# Patient Record
Sex: Female | Born: 1965 | Smoking: Never smoker
Health system: Southern US, Community
[De-identification: ages and names within clinical notes are randomized; demographics above are authoritative.]

## PROBLEM LIST (undated history)

## (undated) HISTORY — PX: BREAST SURGERY: SHX581

## (undated) HISTORY — PX: CHOLECYSTECTOMY: SHX55

## (undated) HISTORY — PX: BREAST LUMPECTOMY: SHX2

## (undated) HISTORY — PX: BREAST REDUCTION SURGERY: SHX8

---

## 2018-11-03 ENCOUNTER — Other Ambulatory Visit: Payer: Self-pay

## 2018-11-03 DIAGNOSIS — Z20822 Contact with and (suspected) exposure to covid-19: Secondary | ICD-10-CM

## 2018-11-04 LAB — NOVEL CORONAVIRUS, NAA: SARS-CoV-2, NAA: NOT DETECTED

## 2018-12-25 ENCOUNTER — Other Ambulatory Visit: Payer: Self-pay

## 2018-12-25 DIAGNOSIS — Z20822 Contact with and (suspected) exposure to covid-19: Secondary | ICD-10-CM

## 2018-12-26 LAB — NOVEL CORONAVIRUS, NAA: SARS-CoV-2, NAA: NOT DETECTED

## 2018-12-27 ENCOUNTER — Telehealth: Payer: Self-pay | Admitting: General Practice

## 2018-12-27 NOTE — Telephone Encounter (Signed)
Negative COVID results given. Patient results "NOT Detected." Caller expressed understanding. ° °

## 2019-03-26 ENCOUNTER — Other Ambulatory Visit: Payer: Self-pay | Admitting: Orthopedic Surgery

## 2019-03-26 DIAGNOSIS — M75101 Unspecified rotator cuff tear or rupture of right shoulder, not specified as traumatic: Secondary | ICD-10-CM

## 2019-03-26 DIAGNOSIS — M7541 Impingement syndrome of right shoulder: Secondary | ICD-10-CM

## 2019-03-31 ENCOUNTER — Other Ambulatory Visit: Payer: Self-pay

## 2019-03-31 ENCOUNTER — Ambulatory Visit
Admission: RE | Admit: 2019-03-31 | Discharge: 2019-03-31 | Disposition: A | Discharge status: Home / Self Care | Payer: BC Managed Care – PPO | Source: Ambulatory Visit | Attending: Orthopedic Surgery | Admitting: Orthopedic Surgery

## 2019-03-31 DIAGNOSIS — M75101 Unspecified rotator cuff tear or rupture of right shoulder, not specified as traumatic: Secondary | ICD-10-CM | POA: Diagnosis present

## 2019-03-31 DIAGNOSIS — M7541 Impingement syndrome of right shoulder: Secondary | ICD-10-CM | POA: Insufficient documentation

## 2019-04-24 ENCOUNTER — Other Ambulatory Visit: Payer: Self-pay | Admitting: Surgery

## 2019-04-27 ENCOUNTER — Encounter
Admission: RE | Admit: 2019-04-27 | Discharge: 2019-04-27 | Disposition: A | Discharge status: Home / Self Care | Payer: BC Managed Care – PPO | Source: Ambulatory Visit | Attending: Surgery | Admitting: Surgery

## 2019-04-27 ENCOUNTER — Other Ambulatory Visit: Payer: Self-pay

## 2019-04-27 NOTE — Pre-Procedure Instructions (Signed)
Dr Roland Rack contacted via secure chat regarding order for consent and patients perception of her procedure. See below:   Me: Hey Dr Roland Rack I need some clarification on her consent. The order is for a right shoulder manipulation under anesthesia with steroid injection. When I interviewed the patient she was under the impression you were also going to remove a bone spur and/or repair anything else internally you had found. Please advise. TY    Dr Roland Rack: Yes, the patient is correct. The procedure is going to be a manipulation under anesthesia with steroid injection, possible right shoulder arthroscopy with debridement and decompression. Basically, if I get a good release with just the manipulation I will stop there. If I do not, then I will proceed with the arthroscopic procedure. Thank you!    Me: I will add this to the consent. Would it be possible for you to add this to your order?

## 2019-04-27 NOTE — Patient Instructions (Signed)
Your procedure is scheduled on: 05/02/19 Report to [facility]. To find out your arrival time please call [phone] between 1PM - 3PM on 05/01/19.  Remember: Instructions that are not followed completely may result in serious medical risk, up to and including death, or upon the discretion of your surgeon and anesthesiologist your surgery may need to be rescheduled.     _X__ 1. Do not eat food after midnight the night before your procedure.                 No gum chewing or hard candies. You may drink clear liquids up to 2 hours                 before you are scheduled to arrive for your surgery- DO not drink clear                 liquids within 2 hours of the start of your surgery.                 Clear Liquids include:  water, apple juice without pulp, clear carbohydrate                 drink such as Clearfast or Gatorade, Black Coffee or Tea (Do not add                 anything to coffee or tea). Diabetics water only  __X__2.  On the morning of surgery brush your teeth with toothpaste and water, you                 may rinse your mouth with mouthwash if you wish.  Do not swallow any              toothpaste of mouthwash.     _X__ 3.  No Alcohol for 24 hours before or after surgery.   _X__ 4.  Do Not Smoke or use e-cigarettes For 24 Hours Prior to Your Surgery.                 Do not use any chewable tobacco products for at least 6 hours prior to                 surgery.  ____  5.  Bring all medications with you on the day of surgery if instructed.   __X__  6.  Notify your doctor if there is any change in your medical condition      (cold, fever, infections).     Do not wear jewelry, make-up, hairpins, clips or nail polish. Do not wear lotions, powders, or perfumes.  Do not shave 48 hours prior to surgery. Men may shave face and neck. Do not bring valuables to the hospital.    [facility] is not responsible for any belongings or  valuables.  Contacts, dentures/partials or body piercings may not be worn into surgery. Bring a case for your contacts, glasses or hearing aids, a denture cup will be supplied. Leave your suitcase in the car. After surgery it may be brought to your room. For patients admitted to the hospital, discharge time is determined by your treatment team.   Patients discharged the day of surgery will not be allowed to drive home.   Please read over the following fact sheets that you were given:   MRSA Information  __X__ Take these medicines the morning of surgery with A SIP OF WATER:  1. none  2. May take valtrex if needed  3.   4.  5.  6.  ____ Fleet Enema (as directed)   __X__ Use CHG Soap/SAGE wipes as directed  ____ Use inhalers on the day of surgery  ____ Stop metformin/Janumet/Farxiga 2 days prior to surgery    ____ Take 1/2 of usual insulin dose the night before surgery. No insulin the morning          of surgery.   ____ Stop Blood Thinners Coumadin/Plavix/Xarelto/Pleta/Pradaxa/Eliquis/Effient/Aspirin  on   Or contact your Surgeon, Cardiologist or Medical Doctor regarding  ability to stop your blood thinners  __X__ Stop Anti-inflammatories 7 days before surgery such as Advil, Ibuprofen, Motrin,  BC or Goodies Powder, Naprosyn, Naproxen, Aleve, Aspirin    __X__ Stop all herbal supplements, fish oil or vitamin E until after surgery. Stopped Melatonin and Aleve 04/27/19   ____ Bring C-Pap to the hospital.

## 2019-04-30 ENCOUNTER — Other Ambulatory Visit: Payer: Self-pay

## 2019-04-30 ENCOUNTER — Other Ambulatory Visit
Admission: RE | Admit: 2019-04-30 | Discharge: 2019-04-30 | Disposition: A | Discharge status: Home / Self Care | Payer: BC Managed Care – PPO | Source: Ambulatory Visit | Attending: Surgery | Admitting: Surgery

## 2019-04-30 DIAGNOSIS — Z20822 Contact with and (suspected) exposure to covid-19: Secondary | ICD-10-CM | POA: Insufficient documentation

## 2019-04-30 DIAGNOSIS — Z01812 Encounter for preprocedural laboratory examination: Secondary | ICD-10-CM | POA: Insufficient documentation

## 2019-04-30 LAB — SARS CORONAVIRUS 2 (TAT 6-24 HRS): SARS Coronavirus 2: NEGATIVE

## 2019-05-02 ENCOUNTER — Ambulatory Visit
Admission: RE | Admit: 2019-05-02 | Discharge: 2019-05-02 | Disposition: A | Discharge status: Home / Self Care | Payer: BC Managed Care – PPO | Attending: Surgery | Admitting: Surgery

## 2019-05-02 ENCOUNTER — Ambulatory Visit: Payer: BC Managed Care – PPO | Admitting: Anesthesiology

## 2019-05-02 ENCOUNTER — Encounter: Payer: Self-pay | Admitting: Surgery

## 2019-05-02 ENCOUNTER — Encounter
Admission: RE | Disposition: A | Discharge status: Home / Self Care | Payer: Self-pay | Source: Home / Self Care | Attending: Surgery

## 2019-05-02 ENCOUNTER — Other Ambulatory Visit: Payer: Self-pay

## 2019-05-02 DIAGNOSIS — Z825 Family history of asthma and other chronic lower respiratory diseases: Secondary | ICD-10-CM | POA: Diagnosis not present

## 2019-05-02 DIAGNOSIS — Z801 Family history of malignant neoplasm of trachea, bronchus and lung: Secondary | ICD-10-CM | POA: Insufficient documentation

## 2019-05-02 DIAGNOSIS — Z9049 Acquired absence of other specified parts of digestive tract: Secondary | ICD-10-CM | POA: Diagnosis not present

## 2019-05-02 DIAGNOSIS — M7501 Adhesive capsulitis of right shoulder: Secondary | ICD-10-CM | POA: Diagnosis not present

## 2019-05-02 DIAGNOSIS — Z79899 Other long term (current) drug therapy: Secondary | ICD-10-CM | POA: Diagnosis not present

## 2019-05-02 HISTORY — PX: CLOSED MANIPULATION SHOULDER WITH STERIOD INJECTION: SHX5611

## 2019-05-02 LAB — POCT PREGNANCY, URINE: Preg Test, Ur: NEGATIVE

## 2019-05-02 SURGERY — CLOSED MANIPULATION SHOULDER WITH STEROID INJECTION
Anesthesia: General | Site: Shoulder | Laterality: Right

## 2019-05-02 MED ORDER — PROPOFOL 500 MG/50ML IV EMUL
INTRAVENOUS | Status: AC
  Filled 2019-05-02: qty 50

## 2019-05-02 MED ORDER — DEXAMETHASONE SODIUM PHOSPHATE 10 MG/ML IJ SOLN
INTRAMUSCULAR | Status: AC
  Filled 2019-05-02: qty 1

## 2019-05-02 MED ORDER — POTASSIUM CHLORIDE IN NACL 20-0.9 MEQ/L-% IV SOLN: INTRAVENOUS | Status: DC

## 2019-05-02 MED ORDER — FAMOTIDINE 20 MG PO TABS: 20.0000 mg | ORAL_TABLET | Freq: Once | ORAL | Status: AC

## 2019-05-02 MED ORDER — ONDANSETRON HCL 4 MG/2ML IJ SOLN: 4.0000 mg | Freq: Four times a day (QID) | INTRAMUSCULAR | Status: DC | PRN

## 2019-05-02 MED ORDER — TRIAMCINOLONE ACETONIDE 40 MG/ML IJ SUSP
INTRAMUSCULAR | Status: AC
  Filled 2019-05-02: qty 1

## 2019-05-02 MED ORDER — EPHEDRINE 5 MG/ML INJ
INTRAVENOUS | Status: AC
  Filled 2019-05-02: qty 20

## 2019-05-02 MED ORDER — LIDOCAINE HCL (PF) 2 % IJ SOLN
INTRAMUSCULAR | Status: AC
  Filled 2019-05-02: qty 10

## 2019-05-02 MED ORDER — FENTANYL CITRATE (PF) 100 MCG/2ML IJ SOLN
25.0000 ug | INTRAMUSCULAR | Status: DC | PRN
  Administered 2019-05-02: 25 ug via INTRAVENOUS

## 2019-05-02 MED ORDER — LACTATED RINGERS IV SOLN: INTRAVENOUS | Status: DC

## 2019-05-02 MED ORDER — CEFAZOLIN SODIUM-DEXTROSE 2-4 GM/100ML-% IV SOLN
INTRAVENOUS | Status: AC
  Filled 2019-05-02: qty 100

## 2019-05-02 MED ORDER — METOCLOPRAMIDE HCL 5 MG/ML IJ SOLN: 5.0000 mg | Freq: Three times a day (TID) | INTRAMUSCULAR | Status: DC | PRN

## 2019-05-02 MED ORDER — FENTANYL CITRATE (PF) 100 MCG/2ML IJ SOLN
INTRAMUSCULAR | Status: AC
  Administered 2019-05-02: 25 ug via INTRAVENOUS
  Filled 2019-05-02: qty 2

## 2019-05-02 MED ORDER — LIDOCAINE HCL (PF) 2 % IJ SOLN
INTRAMUSCULAR | Status: AC
  Filled 2019-05-02: qty 5

## 2019-05-02 MED ORDER — FENTANYL CITRATE (PF) 100 MCG/2ML IJ SOLN
INTRAMUSCULAR | Status: AC
  Filled 2019-05-02: qty 2

## 2019-05-02 MED ORDER — TRAMADOL HCL 50 MG PO TABS: 50.0000 mg | ORAL_TABLET | Freq: Four times a day (QID) | ORAL | Status: DC | PRN

## 2019-05-02 MED ORDER — MIDAZOLAM HCL 2 MG/2ML IJ SOLN
INTRAMUSCULAR | Status: DC | PRN
  Administered 2019-05-02: 2 mg via INTRAVENOUS

## 2019-05-02 MED ORDER — ONDANSETRON HCL 4 MG PO TABS: 4.0000 mg | ORAL_TABLET | Freq: Four times a day (QID) | ORAL | Status: DC | PRN

## 2019-05-02 MED ORDER — BUPIVACAINE-EPINEPHRINE 0.25% -1:200000 IJ SOLN
INTRAMUSCULAR | Status: DC | PRN
  Administered 2019-05-02: 30 mL

## 2019-05-02 MED ORDER — BUPIVACAINE-EPINEPHRINE (PF) 0.25% -1:200000 IJ SOLN
INTRAMUSCULAR | Status: AC
  Filled 2019-05-02: qty 30

## 2019-05-02 MED ORDER — MIDAZOLAM HCL 2 MG/2ML IJ SOLN
INTRAMUSCULAR | Status: AC
  Filled 2019-05-02: qty 2

## 2019-05-02 MED ORDER — CEFAZOLIN SODIUM-DEXTROSE 2-4 GM/100ML-% IV SOLN: 2.0000 g | INTRAVENOUS | Status: DC

## 2019-05-02 MED ORDER — FENTANYL CITRATE (PF) 100 MCG/2ML IJ SOLN
INTRAMUSCULAR | Status: DC | PRN
  Administered 2019-05-02: 50 ug via INTRAVENOUS

## 2019-05-02 MED ORDER — PROPOFOL 500 MG/50ML IV EMUL
INTRAVENOUS | Status: DC | PRN
  Administered 2019-05-02: 140 ug/kg/min via INTRAVENOUS

## 2019-05-02 MED ORDER — ONDANSETRON HCL 4 MG/2ML IJ SOLN: 4.0000 mg | Freq: Once | INTRAMUSCULAR | Status: DC | PRN

## 2019-05-02 MED ORDER — TRAMADOL HCL 50 MG PO TABS: 50.0000 mg | ORAL_TABLET | Freq: Four times a day (QID) | ORAL | 0 refills | Status: AC | PRN

## 2019-05-02 MED ORDER — CHLORHEXIDINE GLUCONATE 4 % EX LIQD: 60.0000 mL | Freq: Once | CUTANEOUS | Status: DC

## 2019-05-02 MED ORDER — TRIAMCINOLONE ACETONIDE 40 MG/ML IJ SUSP
INTRAMUSCULAR | Status: DC | PRN
  Administered 2019-05-02: 40 mg via INTRAMUSCULAR

## 2019-05-02 MED ORDER — SUCCINYLCHOLINE CHLORIDE 200 MG/10ML IV SOSY
PREFILLED_SYRINGE | INTRAVENOUS | Status: AC
  Filled 2019-05-02: qty 10

## 2019-05-02 MED ORDER — GLYCOPYRROLATE 0.2 MG/ML IJ SOLN
INTRAMUSCULAR | Status: AC
  Filled 2019-05-02: qty 1

## 2019-05-02 MED ORDER — FAMOTIDINE 20 MG PO TABS
ORAL_TABLET | ORAL | Status: AC
  Administered 2019-05-02: 20 mg via ORAL
  Filled 2019-05-02: qty 1

## 2019-05-02 MED ORDER — KETOROLAC TROMETHAMINE 30 MG/ML IJ SOLN
INTRAMUSCULAR | Status: AC
  Filled 2019-05-02: qty 1

## 2019-05-02 MED ORDER — ONDANSETRON HCL 4 MG/2ML IJ SOLN
INTRAMUSCULAR | Status: AC
  Filled 2019-05-02: qty 2

## 2019-05-02 MED ORDER — METOCLOPRAMIDE HCL 10 MG PO TABS: 5.0000 mg | ORAL_TABLET | Freq: Three times a day (TID) | ORAL | Status: DC | PRN

## 2019-05-02 SURGICAL SUPPLY — 9 items
BNDG ADH 1X3 SHEER STRL LF (GAUZE/BANDAGES/DRESSINGS) ×1 IMPLANT
COVER WAND RF STERILE (DRAPES) ×1 IMPLANT
KIT TURNOVER KIT A (KITS) ×3 IMPLANT
NDL HYPO 21X1.5 SAFETY (NEEDLE) ×1 IMPLANT
NEEDLE HYPO 21X1.5 SAFETY (NEEDLE) ×3 IMPLANT
PAD ALCOHOL SWAB (MISCELLANEOUS) ×6 IMPLANT
SLING ARM LRG DEEP (SOFTGOODS) ×1 IMPLANT
SLING ARM M TX990204 (SOFTGOODS) ×3 IMPLANT
SYR 10ML LL (SYRINGE) ×3 IMPLANT

## 2019-05-02 NOTE — Transfer of Care (Signed)
Immediate Anesthesia Transfer of Care Note  Patient: Frances Massey  Procedure(s) Performed: RIGHT CLOSED MANIPULATION SHOULDER WITH STEROID INJECTION (Right Shoulder)  Patient Location: PACU  Anesthesia Type:General  Level of Consciousness: awake, alert  and oriented  Airway & Oxygen Therapy: Patient Spontanous Breathing and Patient connected to face mask oxygen  Post-op Assessment: Report given to RN and Post -op Vital signs reviewed and stable  Post vital signs: Reviewed and stable  Last Vitals:  Vitals Value Taken Time  BP 151/79 05/02/19 1017  Temp 36.4 C 05/02/19 1015  Pulse 84 05/02/19 1019  Resp 19 05/02/19 1019  SpO2 99 % 05/02/19 1019  Vitals shown include unvalidated device data.  Last Pain:  Vitals:   05/02/19 1015  TempSrc:   PainSc: 2          Complications: No apparent anesthesia complications

## 2019-05-02 NOTE — Anesthesia Postprocedure Evaluation (Signed)
Anesthesia Post Note  Patient: Gracin Abramyan  Procedure(s) Performed: RIGHT CLOSED MANIPULATION SHOULDER WITH STEROID INJECTION (Right Shoulder)  Patient location during evaluation: PACU Anesthesia Type: General Level of consciousness: awake and alert and oriented Pain management: pain level controlled Vital Signs Assessment: post-procedure vital signs reviewed and stable Respiratory status: spontaneous breathing, nonlabored ventilation and respiratory function stable Cardiovascular status: blood pressure returned to baseline and stable Postop Assessment: no signs of nausea or vomiting Anesthetic complications: no     Last Vitals:  Vitals:   05/02/19 1047 05/02/19 1056  BP: 134/76 135/65  Pulse: 72 69  Resp: 20 16  Temp: (!) 36.4 C 36.7 C  SpO2: 98% 100%    Last Pain:  Vitals:   05/02/19 1056  TempSrc: Temporal  PainSc: 2                  Maryana Pittmon

## 2019-05-02 NOTE — Discharge Instructions (Addendum)
Orthopedic discharge instructions: May shower as necessary.  Apply ice frequently to shoulder. Take Aleve 2 tablets BID with meals for 7-10 days, then as necessary. May supplement with ES Tylenol if necessary. Take pain medication as prescribed when needed.  Use sling as necessary for comfort, but you are encouraged to remove it as soon as possible. Start physical therapy on Thursday as scheduled. Follow-up in 10-14 days or as scheduled.  AMBULATORY SURGERY  DISCHARGE INSTRUCTIONS   1) The drugs that you were given will stay in your system until tomorrow so for the next 24 hours you should not:  A) Drive an automobile B) Make any legal decisions C) Drink any alcoholic beverage   2) You may resume regular meals tomorrow.  Today it is better to start with liquids and gradually work up to solid foods.  You may eat anything you prefer, but it is better to start with liquids, then soup and crackers, and gradually work up to solid foods.   3) Please notify your doctor immediately if you have any unusual bleeding, trouble breathing, redness and pain at the surgery site, drainage, fever, or pain not relieved by medication.    4) Additional Instructions:        Please contact your physician with any problems or Same Day Surgery at [phone], Monday through Friday 6 am to 4 pm, or  at [facility] number at [phone].

## 2019-05-02 NOTE — Anesthesia Preprocedure Evaluation (Signed)
Anesthesia Evaluation  Patient identified by MRN, date of birth, ID band Patient awake    Reviewed: Allergy & Precautions, NPO status , Patient's Chart, lab work & pertinent test results  History of Anesthesia Complications Negative for: history of anesthetic complications  Airway Mallampati: I  TM Distance: >3 FB Neck ROM: Full    Dental no notable dental hx.    Pulmonary neg pulmonary ROS, neg sleep apnea, neg COPD,    breath sounds clear to auscultation- rhonchi (-) wheezing      Cardiovascular Exercise Tolerance: Good (-) hypertension(-) CAD and (-) Past MI  Rhythm:Regular Rate:Normal - Systolic murmurs and - Diastolic murmurs    Neuro/Psych negative neurological ROS  negative psych ROS   GI/Hepatic negative GI ROS, Neg liver ROS,   Endo/Other  negative endocrine ROSneg diabetes  Renal/GU negative Renal ROS     Musculoskeletal negative musculoskeletal ROS (+)   Abdominal (+) - obese,   Peds  Hematology negative hematology ROS (+)   Anesthesia Other Findings    Reproductive/Obstetrics                             Anesthesia Physical Anesthesia Plan  ASA: I  Anesthesia Plan: General   Post-op Pain Management:  Regional for Post-op pain   Induction: Intravenous  PONV Risk Score and Plan: 2 and Propofol infusion  Airway Management Planned: Natural Airway  Additional Equipment:   Intra-op Plan:   Post-operative Plan:   Informed Consent: I have reviewed the patients History and Physical, chart, labs and discussed the procedure including the risks, benefits and alternatives for the proposed anesthesia with the patient or authorized representative who has indicated his/her understanding and acceptance.     Dental advisory given  Plan Discussed with: CRNA and Anesthesiologist  Anesthesia Plan Comments: (Discussed possible intubation if need to proceed with shoulder scope.  Discussed possible post op block for pain if we proceed with shoulder scope.)        Anesthesia Quick Evaluation

## 2019-05-02 NOTE — Op Note (Signed)
05/02/2019  10:09 AM  Patient:   Frances Massey  Pre-Op Diagnosis:   Primary adhesive capsulitis, right shoulder.  Post-Op Diagnosis:   Same  Procedure:   Manipulation under anesthesia with steroid injection, right shoulder.  Surgeon:   Pascal Lux, MD  Assistant:   None  Anesthesia:   IV sedation  Findings:   As above. Prior to manipulation, the right shoulder could be forward flexed to 135 and abducted to 130. At 90 of abduction, the shoulder could be externally rotated to 70 and internally rotated to 45. Following manipulation, the shoulder could be forward flexed to 170, abducted to 165 and, at 90 of abduction, externally rotated to 95 and internally rotated to 70.  Complications:   None  EBL:   0 cc  Fluids:   500 cc crystalloid  TT:   None  Drains:   None  Closure:   None  Brief Clinical Note:   The patient is a 54 year old female with a 69-month history of gradually worsening right shoulder pain which developed without any specific cause or injury. Despite extensive nonsurgical treatment, including injections, activity modification, medications, etc., the patient continues to have difficulty regaining shoulder range of motion. The patient's history and examination are consistent with adhesive capsulitis. The patient presents at this time for a manipulation under anesthesia with steroid injection of the right shoulder.  Procedure:   The patient was brought into the operating room and lain in the supine position. After adequate IV sedation was achieved, a timeout was performed to verify the correct surgical site. The right shoulder was gently manipulated in both abduction and external rotation, as well as adduction and internal rotation. Several palpable and audible pops were heard as the scar tissue released, permitting full range of motion of the shoulder. The glenohumeral joint was injected sterilely using 1 cc of Kenalog-40 and 9 cc of 0.25% Sensorcaine with  epinephrine before the patient was placed into a sling. The patient was then awakened and returned to the recovery room in satisfactory condition after tolerating the procedure well.

## 2019-05-02 NOTE — H&P (Signed)
Paper H&P to be scanned into permanent record. H&P reviewed and patient re-examined. No changes. 

## 2020-09-08 ENCOUNTER — Encounter: Payer: Self-pay | Admitting: Gastroenterology

## 2020-09-18 ENCOUNTER — Ambulatory Visit (AMBULATORY_SURGERY_CENTER): Payer: BC Managed Care – PPO

## 2020-09-18 ENCOUNTER — Other Ambulatory Visit: Payer: Self-pay

## 2020-09-18 VITALS — Ht 61.0 in | Wt 160.0 lb

## 2020-09-18 DIAGNOSIS — Z1211 Encounter for screening for malignant neoplasm of colon: Secondary | ICD-10-CM

## 2020-09-18 NOTE — Progress Notes (Signed)
Patient's pre-visit was done today over the phone with the patient Name,DOB and address verified. Patient denies any allergies to Eggs and Soy. Patient denies any problems with anesthesia/sedation. Patient denies taking diet pills or blood thinners. No home Oxygen. Packet of Prep instructions mailed to patient including a copy of a consent form-pt is aware. Patient understands to call us back with any questions or concerns. Patient is aware of our care-partner policy and 0000000 safety protocol.   The patient is COVID-19 vaccinated.

## 2020-09-30 ENCOUNTER — Encounter: Payer: Self-pay | Admitting: Gastroenterology

## 2020-09-30 ENCOUNTER — Ambulatory Visit (AMBULATORY_SURGERY_CENTER): Payer: BC Managed Care – PPO | Admitting: Gastroenterology

## 2020-09-30 VITALS — BP 108/78 | HR 58 | Temp 99.1°F | Resp 14 | Ht 61.0 in | Wt 160.0 lb

## 2020-09-30 DIAGNOSIS — D122 Benign neoplasm of ascending colon: Secondary | ICD-10-CM

## 2020-09-30 DIAGNOSIS — Z1211 Encounter for screening for malignant neoplasm of colon: Secondary | ICD-10-CM | POA: Diagnosis not present

## 2020-09-30 MED ORDER — SODIUM CHLORIDE 0.9 % IV SOLN: 500.0000 mL | Freq: Once | INTRAVENOUS | Status: DC

## 2020-09-30 NOTE — Op Note (Signed)
[facility] Patient Name: Frances Massey Procedure Date: 09/30/2020 2:23 PM MRN: [id number] Endoscopist: Justice Britain , MD Age: 55 Referring MD:  Date of Birth: 07-13-1965 Gender: Female Account #: [id number] Procedure:                Colonoscopy Indications:              Screening for colorectal malignant neoplasm, This                            is the patient's first colonoscopy Medicines:                Monitored Anesthesia Care Procedure:                Pre-Anesthesia Assessment:                           - Prior to the procedure, a History and Physical                            was performed, and patient medications and                            allergies were reviewed. The patient's tolerance of                            previous anesthesia was also reviewed. The risks                            and benefits of the procedure and the sedation                            options and risks were discussed with the patient.                            All questions were answered, and informed consent                            was obtained. Prior Anticoagulants: The patient has                            taken no previous anticoagulant or antiplatelet                            agents except for NSAID medication. ASA Grade                            Assessment: II - A patient with mild systemic                            disease. After reviewing the risks and benefits,                            the patient was deemed in satisfactory condition to  undergo the procedure.                           After obtaining informed consent, the colonoscope                            was passed under direct vision. Throughout the                            procedure, the patient's blood pressure, pulse, and                            oxygen saturations were monitored continuously. The                            Olympus PCF-H190DL (#[id number]) Colonoscope  was                            introduced through the anus and advanced to the 5                            cm into the ileum. The colonoscopy was performed                            without difficulty. The patient tolerated the                            procedure. The quality of the bowel preparation was                            good. The terminal ileum, ileocecal valve,                            appendiceal orifice, and rectum were photographed. Scope In: 2:31:34 PM Scope Out: 2:45:59 PM Scope Withdrawal Time: 0 hours 10 minutes 53 seconds  Total Procedure Duration: 0 hours 14 minutes 25 seconds  Findings:                 The digital rectal exam findings include                            hemorrhoids. Pertinent negatives include no                            palpable rectal lesions.                           The terminal ileum and ileocecal valve appeared                            normal.                           A 2 mm polyp was found in the ascending colon. The  polyp was sessile. The polyp was removed with a                            cold snare. Resection and retrieval were complete.                           A few small-mouthed diverticula were found in the                            sigmoid colon.                           Normal mucosa was found in the entire colon                            otherwise.                           Non-bleeding non-thrombosed internal hemorrhoids                            were found during retroflexion, during perianal                            exam and during digital exam. The hemorrhoids were                            Grade II (internal hemorrhoids that prolapse but                            reduce spontaneously). Complications:            No immediate complications. Estimated Blood Loss:     Estimated blood loss was minimal. Impression:               - Hemorrhoids found on digital rectal exam.                            - The examined portion of the ileum was normal.                           - One 2 mm polyp in the ascending colon, removed                            with a cold snare. Resected and retrieved.                           - Diverticulosis in the sigmoid colon.                           - Normal mucosa in the entire examined colon                            otherwise.                           -  Non-bleeding non-thrombosed internal hemorrhoids. Recommendation:           - The patient will be observed post-procedure,                            until all discharge criteria are met.                           - Discharge patient to home.                           - Patient has a contact number available for                            emergencies. The signs and symptoms of potential                            delayed complications were discussed with the                            patient. Return to normal activities tomorrow.                            Written discharge instructions were provided to the                            patient.                           - High fiber diet.                           - Use FiberCon 1-2 tablets PO daily.                           - Continue present medications.                           - Await pathology results.                           - Repeat colonoscopy in 06/21/08 years for                            surveillance based on pathology results and                            findings of adenomatous tissue.                           - The findings and recommendations were discussed                            with the patient.                           - The findings and recommendations were discussed  with the patient's family. Justice Britain, MD 09/30/2020 2:52:38 PM

## 2020-09-30 NOTE — Patient Instructions (Signed)
Handout given:  diverticulosis, polyps, hemorrhoids Start a higher fiber diet  Use Fibercon 1-2 tab by mouth daily Continue current medications Await pathology results  YOU HAD AN ENDOSCOPIC PROCEDURE TODAY AT [facility]:   Refer to the procedure report that was given to you for any specific questions about what was found during the examination.  If the procedure report does not answer your questions, please call your gastroenterologist to clarify.  If you requested that your care partner not be given the details of your procedure findings, then the procedure report has been included in a sealed envelope for you to review at your convenience later.  YOU SHOULD EXPECT: Some feelings of bloating in the abdomen. Passage of more gas than usual.  Walking can help get rid of the air that was put into your GI tract during the procedure and reduce the bloating. If you had a lower endoscopy (such as a colonoscopy or flexible sigmoidoscopy) you may notice spotting of blood in your stool or on the toilet paper. If you underwent a bowel prep for your procedure, you may not have a normal bowel movement for a few days.  Please Note:  You might notice some irritation and congestion in your nose or some drainage.  This is from the oxygen used during your procedure.  There is no need for concern and it should clear up in a day or so.  SYMPTOMS TO REPORT IMMEDIATELY:  Following lower endoscopy (colonoscopy or flexible sigmoidoscopy):  Excessive amounts of blood in the stool  Significant tenderness or worsening of abdominal pains  Swelling of the abdomen that is new, acute  Fever of 100F or higher  For urgent or emergent issues, a gastroenterologist can be reached at any hour by calling [phone]. Do not use MyChart messaging for urgent concerns.   DIET:  We do recommend a small meal at first, but then you may proceed to your regular diet.  Drink plenty of fluids but you should avoid  alcoholic beverages for 24 hours.  ACTIVITY:  You should plan to take it easy for the rest of today and you should NOT DRIVE or use heavy machinery until tomorrow (because of the sedation medicines used during the test).    FOLLOW UP: Our staff will call the number listed on your records 48-72 hours following your procedure to check on you and address any questions or concerns that you may have regarding the information given to you following your procedure. If we do not reach you, we will leave a message.  We will attempt to reach you two times.  During this call, we will ask if you have developed any symptoms of COVID 19. If you develop any symptoms (ie: fever, flu-like symptoms, shortness of breath, cough etc.) before then, please call [phone].  If you test positive for Covid 19 in the 2 weeks post procedure, please call and report this information to [place].    If any biopsies w SIGNATURES/CONFIDENTIALITY: You and/or your care partner have signed paperwork which will be entered into your electronic medical record.  These signatures attest to the fact that that the information above on your After Visit Summary has been reviewed and is understood.  Full responsibility of the confidentiality of this discharge information lies with you and/or your care-partner.

## 2020-09-30 NOTE — Progress Notes (Signed)
GASTROENTEROLOGY PROCEDURE H&P NOTE   Primary Care Physician: Pcp, No  HPI: Frances Massey is a 55 y.o. female who presents for Colonoscopy for screening.  No past medical history on file. Past Surgical History:  Procedure Laterality Date   BREAST LUMPECTOMY Right    BREAST REDUCTION SURGERY     BREAST SURGERY     CHOLECYSTECTOMY     CLOSED MANIPULATION SHOULDER WITH STERIOD INJECTION Right 05/02/2019   Procedure: RIGHT CLOSED MANIPULATION SHOULDER WITH STEROID INJECTION;  Surgeon: Corky Mull, MD;  Location: [facility] ORS;  Service: Orthopedics;  Laterality: Right;   Current Outpatient Medications  Medication Sig Dispense Refill   acetaminophen (TYLENOL) 500 MG tablet Take 1,500-2,000 mg by mouth 2 (two) times daily as needed for moderate pain. (Patient not taking: Reported on 09/18/2020)     eszopiclone (LUNESTA) 2 MG TABS tablet Take 2 mg by mouth at bedtime as needed for sleep. Take immediately before bedtime (Patient not taking: Reported on 09/18/2020)     Ibuprofen (MOTRIN PO) Take by mouth.     LO LOESTRIN FE 1 MG-10 MCG / 10 MCG tablet Take 1 tablet by mouth daily.     Magnesium 250 MG TABS Take 250 mg by mouth daily.     Melatonin 5 MG CAPS Take 15 mg by mouth at bedtime.     Menthol-Methyl Salicylate (SALONPAS PAIN RELIEF PATCH) PTCH Apply 1 patch topically daily as needed (pain). (Patient not taking: Reported on 09/18/2020)     Multiple Vitamin (MULTIVITAMIN WITH MINERALS) TABS tablet Take 1 tablet by mouth daily.     naproxen sodium (ALEVE) 220 MG tablet Take 440 mg by mouth 2 (two) times daily as needed (pain). (Patient not taking: Reported on 09/18/2020)     valACYclovir (VALTREX) 1000 MG tablet Take 2 g by mouth 2 (two) times daily as needed (fever blister).      No current facility-administered medications for this visit.    Current Outpatient Medications:    acetaminophen (TYLENOL) 500 MG tablet, Take 1,500-2,000 mg by mouth 2 (two) times daily as needed for moderate pain.  (Patient not taking: Reported on 09/18/2020), Disp: , Rfl:    eszopiclone (LUNESTA) 2 MG TABS tablet, Take 2 mg by mouth at bedtime as needed for sleep. Take immediately before bedtime (Patient not taking: Reported on 09/18/2020), Disp: , Rfl:    Ibuprofen (MOTRIN PO), Take by mouth., Disp: , Rfl:    LO LOESTRIN FE 1 MG-10 MCG / 10 MCG tablet, Take 1 tablet by mouth daily., Disp: , Rfl:    Magnesium 250 MG TABS, Take 250 mg by mouth daily., Disp: , Rfl:    Melatonin 5 MG CAPS, Take 15 mg by mouth at bedtime., Disp: , Rfl:    Menthol-Methyl Salicylate (SALONPAS PAIN RELIEF PATCH) PTCH, Apply 1 patch topically daily as needed (pain). (Patient not taking: Reported on 09/18/2020), Disp: , Rfl:    Multiple Vitamin (MULTIVITAMIN WITH MINERALS) TABS tablet, Take 1 tablet by mouth daily., Disp: , Rfl:    naproxen sodium (ALEVE) 220 MG tablet, Take 440 mg by mouth 2 (two) times daily as needed (pain). (Patient not taking: Reported on 09/18/2020), Disp: , Rfl:    valACYclovir (VALTREX) 1000 MG tablet, Take 2 g by mouth 2 (two) times daily as needed (fever blister). , Disp: , Rfl:  Allergies  Allergen Reactions   Erythromycin Nausea And Vomiting   Vicodin [Hydrocodone-Acetaminophen] Hives   Family History  Problem Relation Age of Onset   Colon  cancer Neg Hx    Colon polyps Neg Hx    Esophageal cancer Neg Hx    Rectal cancer Neg Hx    Stomach cancer Neg Hx    Social History   Socioeconomic History   Marital status: Unknown    Spouse name: Not on file   Number of children: Not on file   Years of education: Not on file   Highest education level: Not on file  Occupational History   Not on file  Tobacco Use   Smoking status: Never   Smokeless tobacco: Never  Vaping Use   Vaping Use: Never used  Substance and Sexual Activity   Alcohol use: Yes    Comment: rarely   Drug use: Never   Sexual activity: Not on file  Other Topics Concern   Not on file  Social History Narrative   Not on file   Social  Determinants of Health   Financial Resource Strain: Not on file  Food Insecurity: Not on file  Transportation Needs: Not on file  Physical Activity: Not on file  Stress: Not on file  Social Connections: Not on file  Intimate Partner Violence: Not on file    Physical Exam: Vital signs in last 24 hours: '@VSRANGES'$ @   GEN: NAD EYE: Sclerae anicteric ENT: MMM CV: Non-tachycardic GI: Soft, NT/ND NEURO:  Alert & Oriented x 3  Lab Results: No results for input(s): WBC, HGB, HCT, PLT in the last 72 hours. BMET No results for input(s): NA, K, CL, CO2, GLUCOSE, BUN, CREATININE, CALCIUM in the last 72 hours. LFT No results for input(s): PROT, ALBUMIN, AST, ALT, ALKPHOS, BILITOT, BILIDIR, IBILI in the last 72 hours. PT/INR No results for input(s): LABPROT, INR in the last 72 hours.   Impression / Plan: This is a 55 y.o.female who presents for Colonoscopy for screening.  The risks and benefits of endoscopic evaluation/treatment were discussed with the patient and/or family; these include but are not limited to the risk of perforation, infection, bleeding, missed lesions, lack of diagnosis, severe illness requiring hospitalization, as well as anesthesia and sedation related illnesses.  The patient's history has been reviewed, patient examined, no change in status, and deemed stable for procedure.  The patient and/or family is agreeable to proceed.    Justice Britain, MD [facility] # [id number]

## 2020-09-30 NOTE — Progress Notes (Signed)
pt tolerated well. VSS. awake and to recovery. Report given to RN.  

## 2020-09-30 NOTE — Progress Notes (Signed)
Called to room to assist during endoscopic procedure.  Patient ID and intended procedure confirmed with present staff. Received instructions for my participation in the procedure from the performing physician.  

## 2020-10-02 ENCOUNTER — Telehealth: Payer: Self-pay | Admitting: *Deleted

## 2020-10-02 ENCOUNTER — Telehealth: Payer: Self-pay

## 2020-10-02 NOTE — Telephone Encounter (Signed)
Second attempt follow up call to pt, no answer.  

## 2020-10-02 NOTE — Telephone Encounter (Signed)
Attempted f/u phone call. No answer. Left message. °

## 2020-10-03 ENCOUNTER — Encounter: Payer: BC Managed Care – PPO | Admitting: Gastroenterology

## 2020-10-05 ENCOUNTER — Encounter: Payer: Self-pay | Admitting: Gastroenterology

## 2020-12-03 ENCOUNTER — Ambulatory Visit: Payer: Self-pay | Admitting: Adult Health

## 2021-11-27 ENCOUNTER — Other Ambulatory Visit: Payer: Self-pay | Admitting: Student

## 2021-11-27 DIAGNOSIS — Z524 Kidney donor: Secondary | ICD-10-CM

## 2021-11-27 DIAGNOSIS — K432 Incisional hernia without obstruction or gangrene: Secondary | ICD-10-CM

## 2021-12-07 ENCOUNTER — Ambulatory Visit: Admission: RE | Admit: 2021-12-07 | Payer: BC Managed Care – PPO | Source: Ambulatory Visit

## 2021-12-11 IMAGING — MR MR SHOULDER*R* W/O CM
5 series · 38 of 40 positions shown · non-contrast
Comparison: None.

CLINICAL DATA: Right shoulder pain for 3 months radiating into the
right arm.

EXAM:
MRI OF THE RIGHT SHOULDER WITHOUT CONTRAST
TECHNIQUE: Multiplanar, multisequence MR imaging of the shoulder was performed.
No intravenous contrast was administered.

[Series 3: PD fat-sat · axial · right · 4.0mm · 0.55mm/px · z∈[-32,+98]mm · 8 of 28 slices shown (1 of 2)]
[im 1/28]
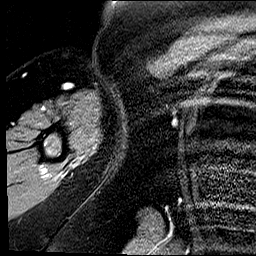
[im 4/28]
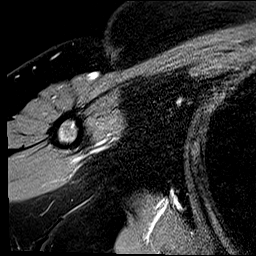
[im 10/28]
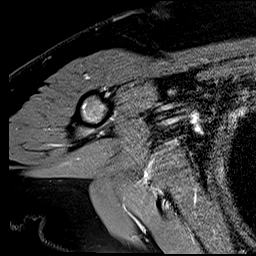
[im 13/28]
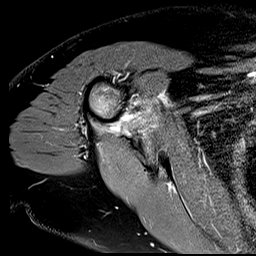
[im 16/28]
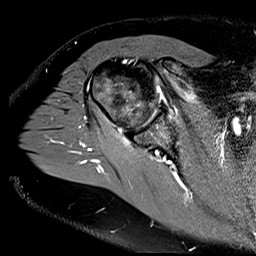
[im 19/28]
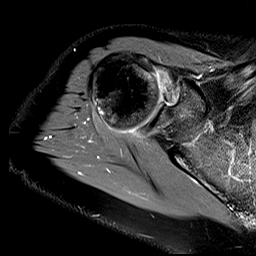
[im 25/28]
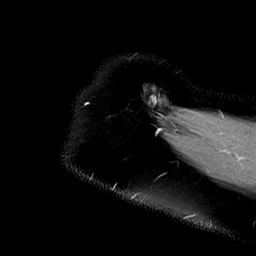
[im 28/28]
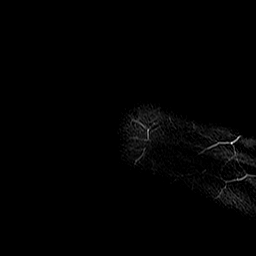

[Series 4: PD fat-sat · oblique · right · 4.0mm · 0.44mm/px · 8 of 26 slices shown (2 of 2)]
[im 1/26]
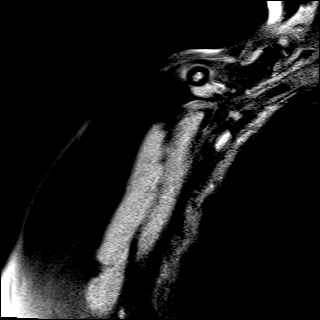
[im 4/26]
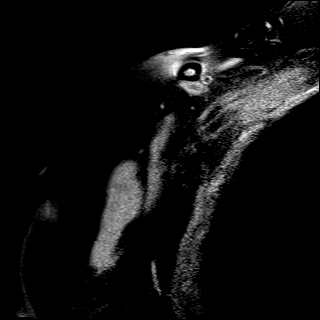
[im 8/26]
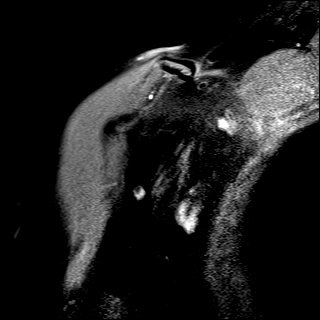
[im 11/26]
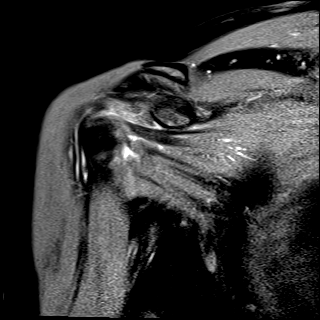
[im 15/26]
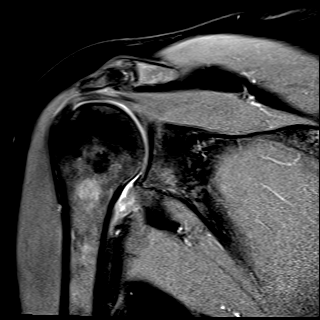
[im 18/26]
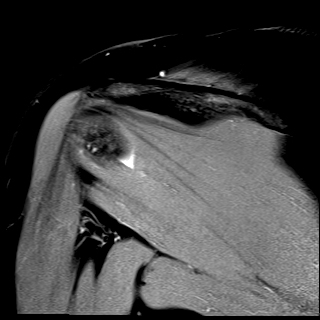
[im 22/26]
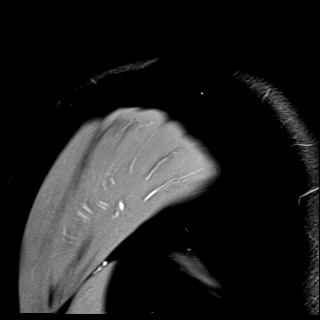
[im 26/26]
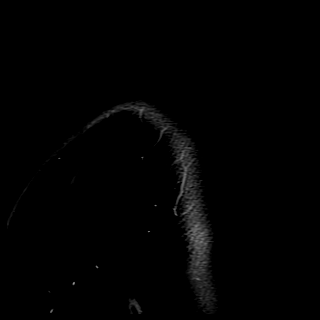

[Series 5: T2 fat-sat · oblique · right · 4.0mm · 0.44mm/px · 8 of 26 slices shown (1 of 2)]
[im 1/26]
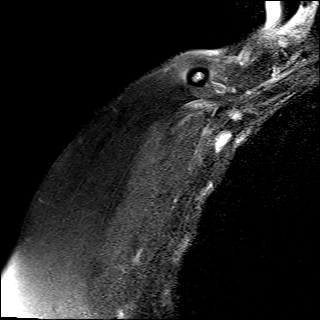
[im 4/26]
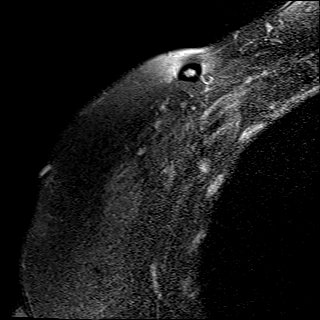
[im 8/26]
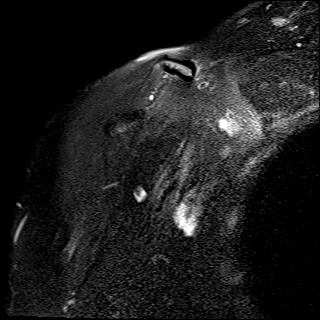
[im 11/26]
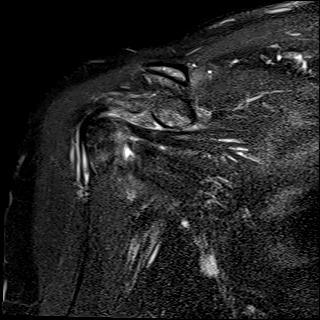
[im 15/26]
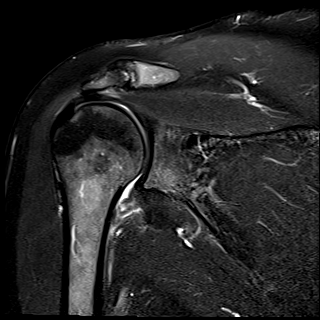
[im 18/26]
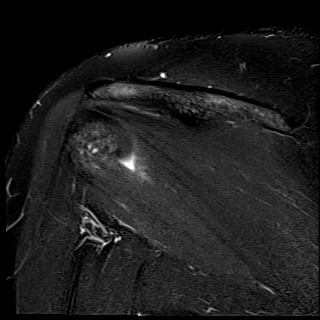
[im 22/26]
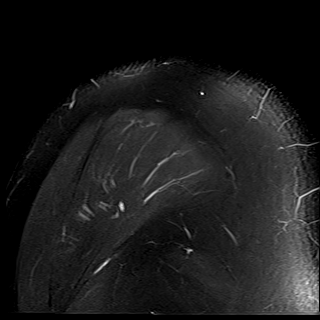
[im 26/26]
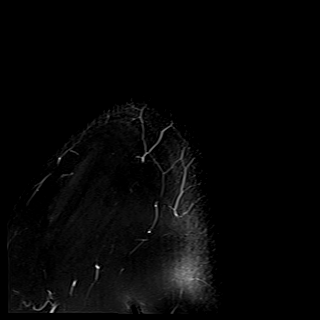

[Series 6: T2 fat-sat · oblique · right · 4.0mm · 0.27mm/px · 7 of 22 slices shown (2 of 2)]
[im 1/22]
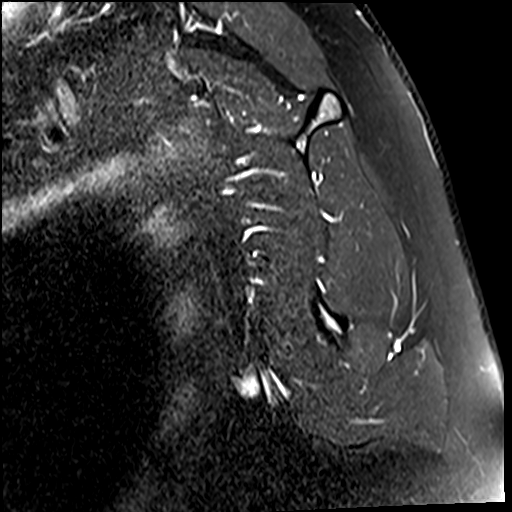
[im 4/22]
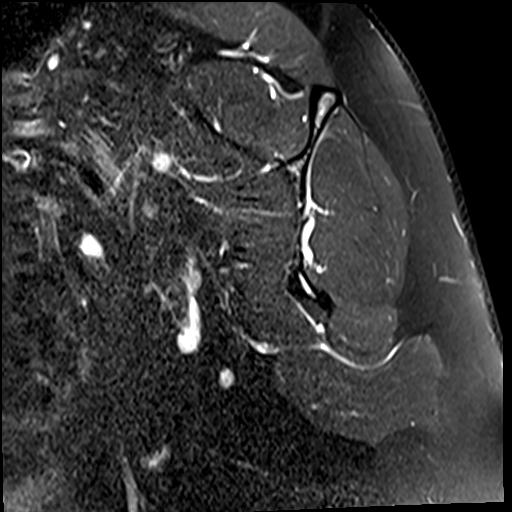
[im 8/22]
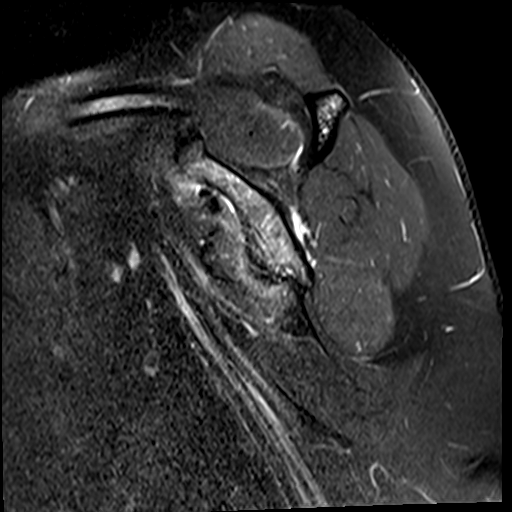
[im 11/22]
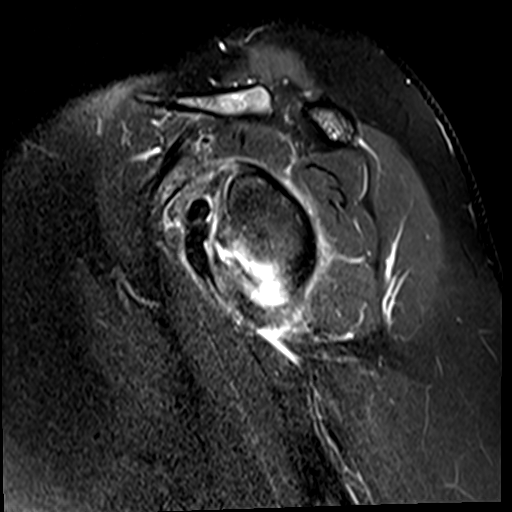
[im 15/22]
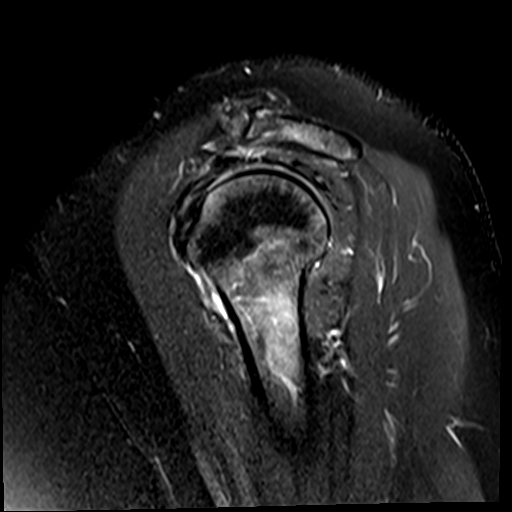
[im 18/22]
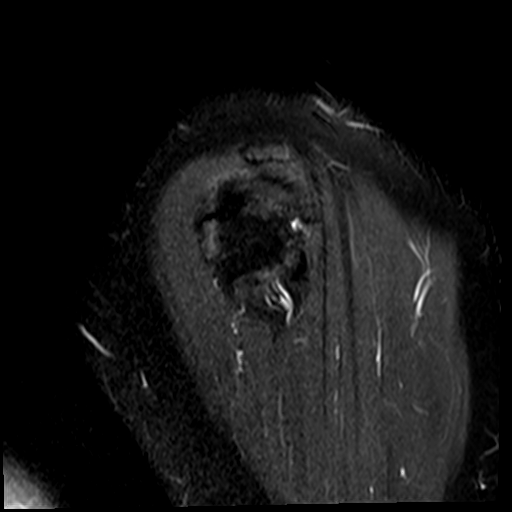
[im 22/22]
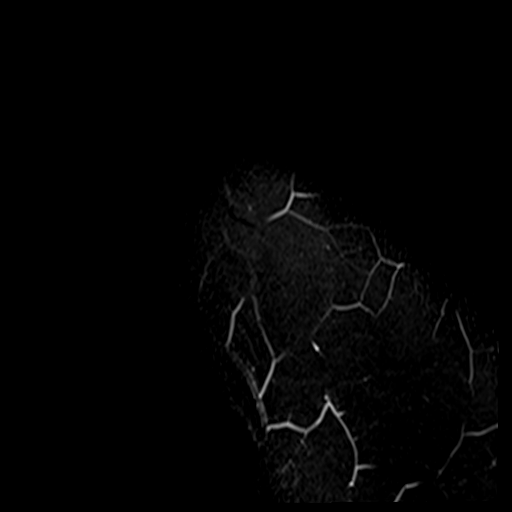

[Series 7: T1 · oblique · right · 4.0mm · 0.44mm/px · 7 of 22 slices shown]
[im 1/22]
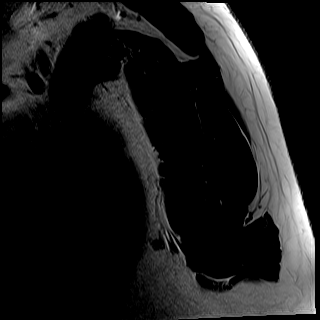
[im 4/22]
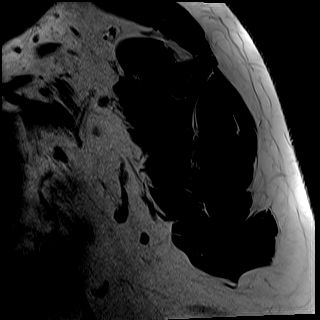
[im 8/22]
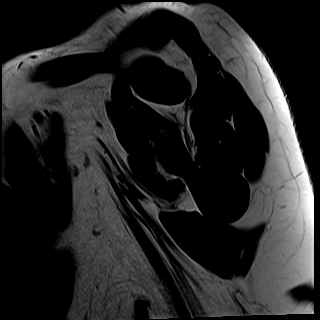
[im 11/22]
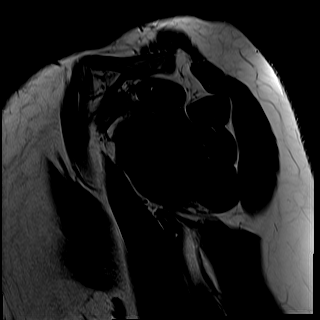
[im 15/22]
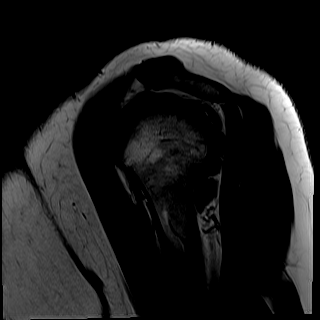
[im 18/22]
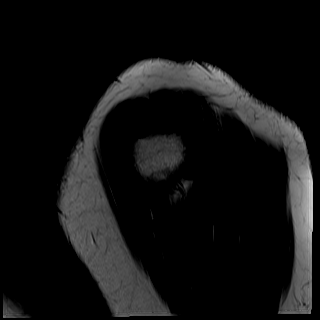
[im 22/22]
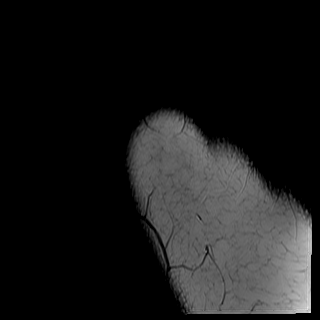

[38 of 40 positions shown; findings below may reference images not displayed]

FINDINGS: Rotator cuff:  Mild subscapularis tendinopathy.

Muscles:  Unremarkable

Biceps long head:  Unremarkable

Acromioclavicular Joint: Mild degenerative AC joint spurring. Type
II acromion. No significant regional bursitis.

Glenohumeral Joint: Suspected synovitis in the rotator interval and
also extending into the subscapular recess. Abnormal thickening of
the inferior glenohumeral ligament which could be due to prior
injury or synovitis. Upper normal amount of fluid in the
glenohumeral joint.

Labrum:  Grossly unremarkable

Bones: No significant extra-articular osseous abnormalities
identified.

Other: No supplemental non-categorized findings.
IMPRESSION: 1. Suspected synovitis in the rotator interval and extending into
the subscapular recess. There is also thickening of the inferior
glenohumeral ligament. This can be associated with adhesive
capsulitis. The only unusual feature for adhesive capsulitis is the
amount of fluid in the glenohumeral joint being in the upper normal
range. Thickening of the inferior glenohumeral ligament can also be
seen in the setting of LOPZ injury.
2. Mild subscapularis tendinopathy.
3. Mild degenerative AC joint spurring.

## 2021-12-14 ENCOUNTER — Ambulatory Visit
Admission: RE | Admit: 2021-12-14 | Discharge: 2021-12-14 | Disposition: A | Discharge status: Home / Self Care | Payer: BC Managed Care – PPO | Source: Ambulatory Visit | Attending: Student | Admitting: Student

## 2021-12-14 DIAGNOSIS — Z524 Kidney donor: Secondary | ICD-10-CM | POA: Diagnosis present

## 2021-12-14 DIAGNOSIS — K432 Incisional hernia without obstruction or gangrene: Secondary | ICD-10-CM | POA: Diagnosis present

## 2022-03-30 ENCOUNTER — Other Ambulatory Visit: Payer: Self-pay | Admitting: Infectious Diseases

## 2022-03-30 DIAGNOSIS — Z1231 Encounter for screening mammogram for malignant neoplasm of breast: Secondary | ICD-10-CM

## 2023-08-12 ENCOUNTER — Encounter: Payer: Self-pay | Admitting: Emergency Medicine

## 2023-08-12 ENCOUNTER — Other Ambulatory Visit: Payer: Self-pay

## 2023-08-12 DIAGNOSIS — R21 Rash and other nonspecific skin eruption: Secondary | ICD-10-CM | POA: Diagnosis present

## 2023-08-12 NOTE — ED Triage Notes (Signed)
 Pt reports rash under eyes and across nose that started couple days ago w/ worsening redness yesterday. Pt reports it started off burning now starting to itch. Pt also reports bilateral eye irritation. Pt took 2 valacyclovir today, in case rash was shingles per pt.

## 2023-08-13 ENCOUNTER — Emergency Department
Admission: EM | Admit: 2023-08-13 | Discharge: 2023-08-13 | Disposition: A | Discharge status: Home / Self Care | Payer: Self-pay | Attending: Emergency Medicine | Admitting: Emergency Medicine

## 2023-08-13 DIAGNOSIS — R21 Rash and other nonspecific skin eruption: Secondary | ICD-10-CM

## 2023-08-13 MED ORDER — OXYCODONE-ACETAMINOPHEN 5-325 MG PO TABS: 2.0000 | ORAL_TABLET | Freq: Three times a day (TID) | ORAL | 0 refills | Status: AC | PRN

## 2023-08-13 MED ORDER — VALACYCLOVIR HCL 1 G PO TABS: 1000.0000 mg | ORAL_TABLET | Freq: Three times a day (TID) | ORAL | 0 refills | Status: AC

## 2023-08-13 MED ORDER — OXYCODONE-ACETAMINOPHEN 5-325 MG PO TABS
2.0000 | ORAL_TABLET | Freq: Once | ORAL | Status: AC
  Administered 2023-08-13: 2 via ORAL
  Filled 2023-08-13: qty 2

## 2023-08-13 NOTE — Discharge Instructions (Addendum)
 As we discussed, it is very possible that you have developed shingles.  We recommend taking valacyclovir 1000 mg every 8 hours for the next 10 days.  You can use over-the-counter ibuprofen and/or Tylenol as needed for pain control.  Take Percocet as prescribed for severe pain. Do not drink alcohol, drive or participate in any other potentially dangerous activities while taking this medication as it may make you sleepy. Do not take this medication with any other sedating medications, either prescription or over-the-counter. If you were prescribed Percocet or Vicodin, do not take these with acetaminophen (Tylenol) as it is already contained within these medications.   This medication is an opiate (or narcotic) pain medication and can be habit forming.  Use it as little as possible to achieve adequate pain control.  Do not use or use it with extreme caution if you have a history of opiate abuse or dependence.  If you are on a pain contract with your primary care doctor or a pain specialist, be sure to let them know you were prescribed this medication today from the [facility].  This medication is intended for your use only - do not give any to anyone else and keep it in a secure place where nobody else, especially children, have access to it.  It will also cause or worsen constipation, so you may want to consider taking an over-the-counter stool softener while you are taking this medication.  Please follow-up with your dermatologist as scheduled.  Return to the emergency department if you develop new or worsening symptoms that concern you.

## 2023-08-13 NOTE — ED Provider Notes (Signed)
 [facility] Provider Note    Event Date/Time   First MD Initiated Contact with Patient 08/13/23 [phone]     (approximate)   History   Rash   HPI Frances Massey is a 58 y.o. female who presents for evaluation of a rash that has been developing primarily on the right side of her face but also somewhat on the left side.  It is stinging and burning and she says she feels like her eyes are little bit irritated as well.  The rash is red and raised with some spots on them.  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  She decided to come get evaluated because she cannot get an appointment with her dermatologist for another 5 days or so.  No fever, no visual changes, no nausea or vomiting.  She is not immunosuppressed.     Physical Exam   Triage Vital Signs: ED Triage Vitals  Encounter Vitals Group     BP 08/12/23 2237 122/70     Girls Systolic BP Percentile --      Girls Diastolic BP Percentile --      Boys Systolic BP Percentile --      Boys Diastolic BP Percentile --      Pulse Rate 08/12/23 2237 91     Resp 08/12/23 2237 16     Temp 08/12/23 2237 98.7 F (37.1 C)     Temp Source 08/12/23 2237 Oral     SpO2 08/12/23 2237 100 %     Weight 08/12/23 2237 59 kg (130 lb)     Height 08/12/23 2237 1.524 m (5')     Head Circumference --      Peak Flow --      Pain Score 08/12/23 2245 4     Pain Loc --      Pain Education --      Exclude from Growth Chart --     Most recent vital signs: Vitals:   08/12/23 2244 08/13/23 0304  BP:  122/70  Pulse:  91  Resp:  18  Temp:  98.4 F (36.9 C)  SpO2: 100% 100%    General: Awake, no obvious distress.  Ears canals are clear with no vesicular lesions. CV:  Good peripheral perfusion.  Resp:  Normal effort. Speaking  easily and comfortably, no accessory muscle usage nor intercostal retractions.   Abd:  No distention.  Skin:  Patient has an erythematous rash with multiple small vesicles and papules scattered primarily across her right upper cheek and the bridge of her nose.  There are a few scattered lesions on the left side as well but it is mostly on the right.  No obvious ocular involvement at this time.  Not consistent with impetigo.   ED Results / Procedures / Treatments   Labs (all labs ordered are listed, but only abnormal results are displayed) Labs Reviewed - No data to display    PROCEDURES:  Critical Care performed: No  Procedures    IMPRESSION / MDM / ASSESSMENT AND PLAN / ED COURSE  I reviewed the triage vital signs and the nursing notes.  Differential diagnosis includes, but is not limited to, shingles, impetigo, facial cellulitis, periorbital or orbital cellulitis.  Patient's presentation is most consistent with acute presentation with potential threat to life or bodily function.   Interventions/Medications given:  Medications  oxyCODONE-acetaminophen (PERCOCET/ROXICET) 5-325 MG per tablet 2 tablet (2 tablets Oral Given 08/13/23 0303)    (Note:  hospital course my include additional interventions and/or labs/studies not listed above.)   Rash most consistent with shingles, no obvious ophthalmic involvement yet.  Does not appear consistent with bacterial infection.  No new medications for her to be a drug reaction.  Oropharynx is clear with no mucosal involvement.  I gave her a new prescription for valacyclovir specifically for shingles treatment and Percocet for pain management.  Recommended close outpatient follow-up with her dermatologist as previously scheduled.  I gave my usual and customary return precautions.       FINAL CLINICAL IMPRESSION(S) / ED DIAGNOSES   Final diagnoses:  Facial rash     Rx / DC Orders   ED Discharge  Orders          Ordered    valACYclovir (VALTREX) 1000 MG tablet  Every 8 hours        08/13/23 0258    oxyCODONE-acetaminophen (PERCOCET) 5-325 MG tablet  Every 8 hours PRN        08/13/23 0258             Note:  This document was prepared using Dragon voice recognition software and may include unintentional dictation errors.   Gordan Huxley, MD 08/13/23 [phone]

## 2024-01-09 ENCOUNTER — Encounter: Payer: Self-pay | Admitting: Obstetrics
